# Patient Record
Sex: Male | Born: 1985 | Race: Black or African American | Hispanic: No | Marital: Single | Smoking: Never smoker
Health system: Southern US, Community
[De-identification: ages and names within clinical notes are randomized; demographics above are authoritative.]

---
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
## 2004-08-21 ENCOUNTER — Emergency Department (HOSPITAL_COMMUNITY): Admission: EM | Admit: 2004-08-21 | Discharge: 2004-08-21 | Payer: Self-pay | Admitting: *Deleted

## 2006-08-19 IMAGING — CR DG CHEST 2V
2 series · 2 of 2 positions shown · non-contrast
Comparison: none

HISTORY: Left chest pain

CHEST 2 VIEWS:
No prior study for comparison
Normal heart size, mediastinal contours, and vascularity.
Lungs clear.
No effusion or pneumothorax.
Bones unremarkable.

[view not recorded (1 of 2)]
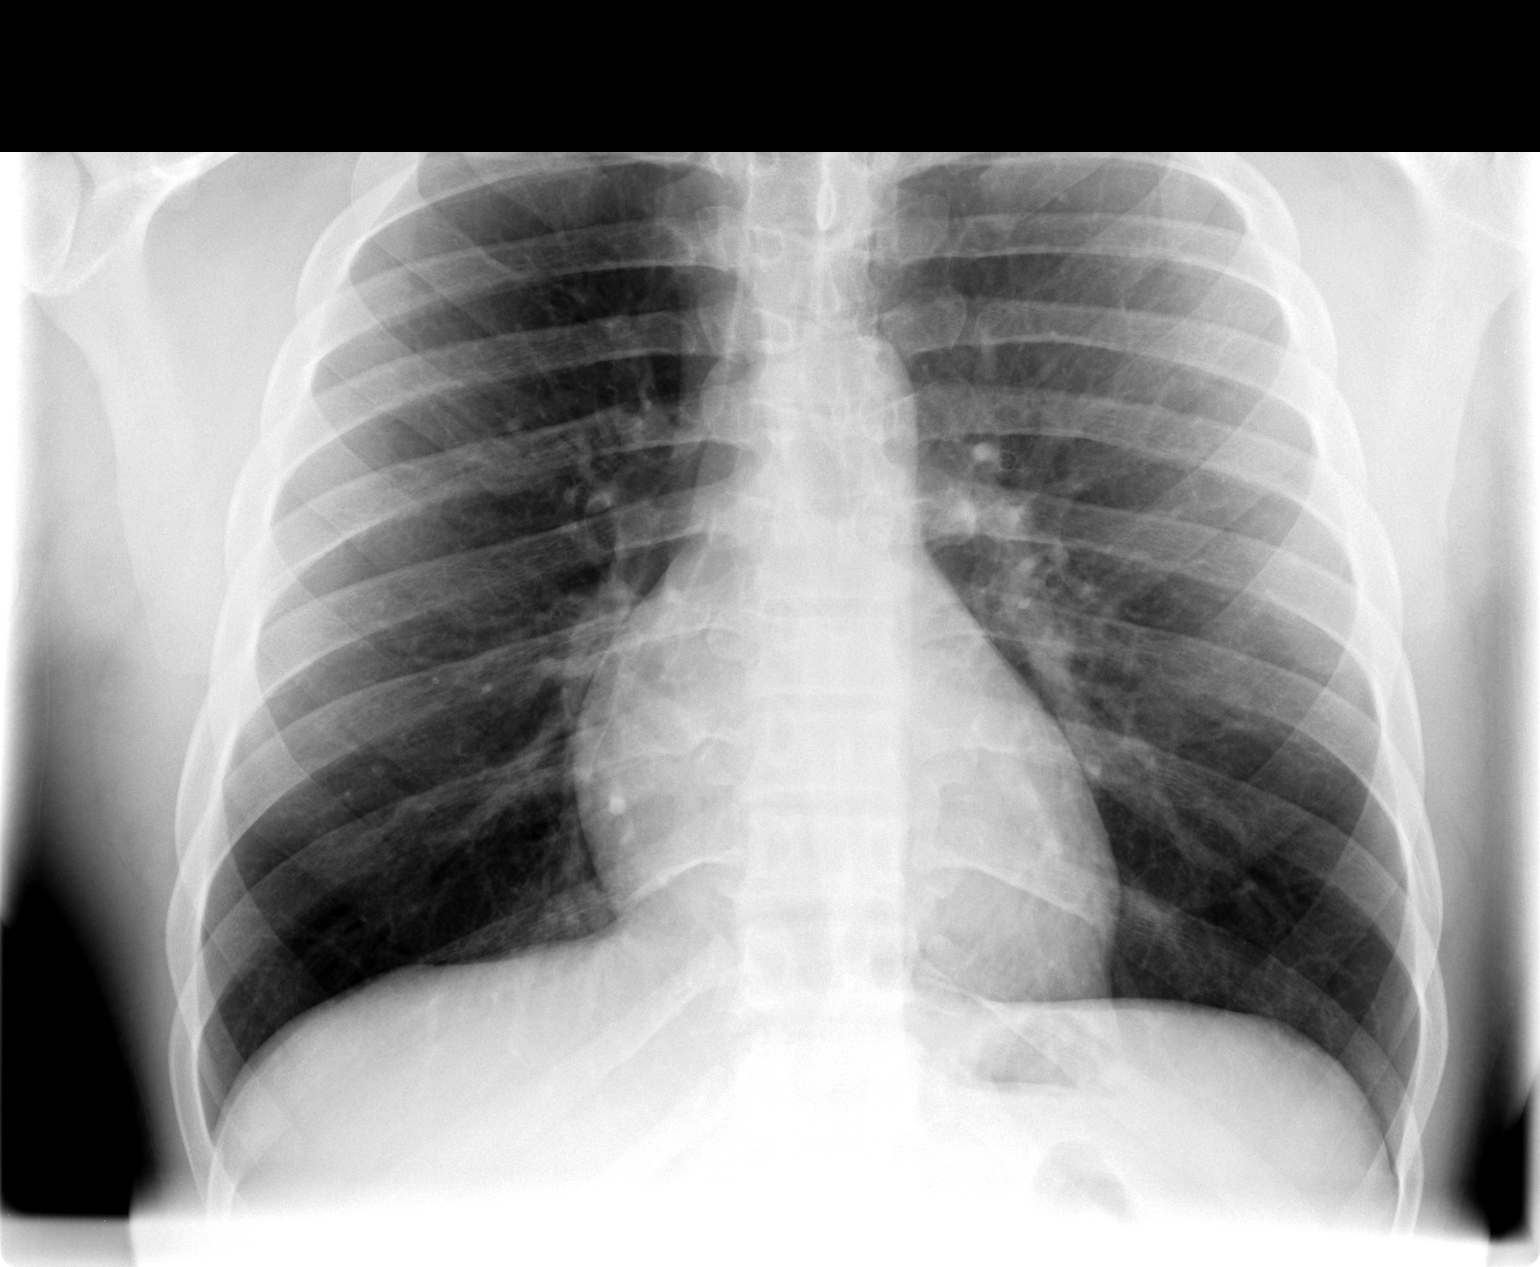

[view not recorded (2 of 2)]
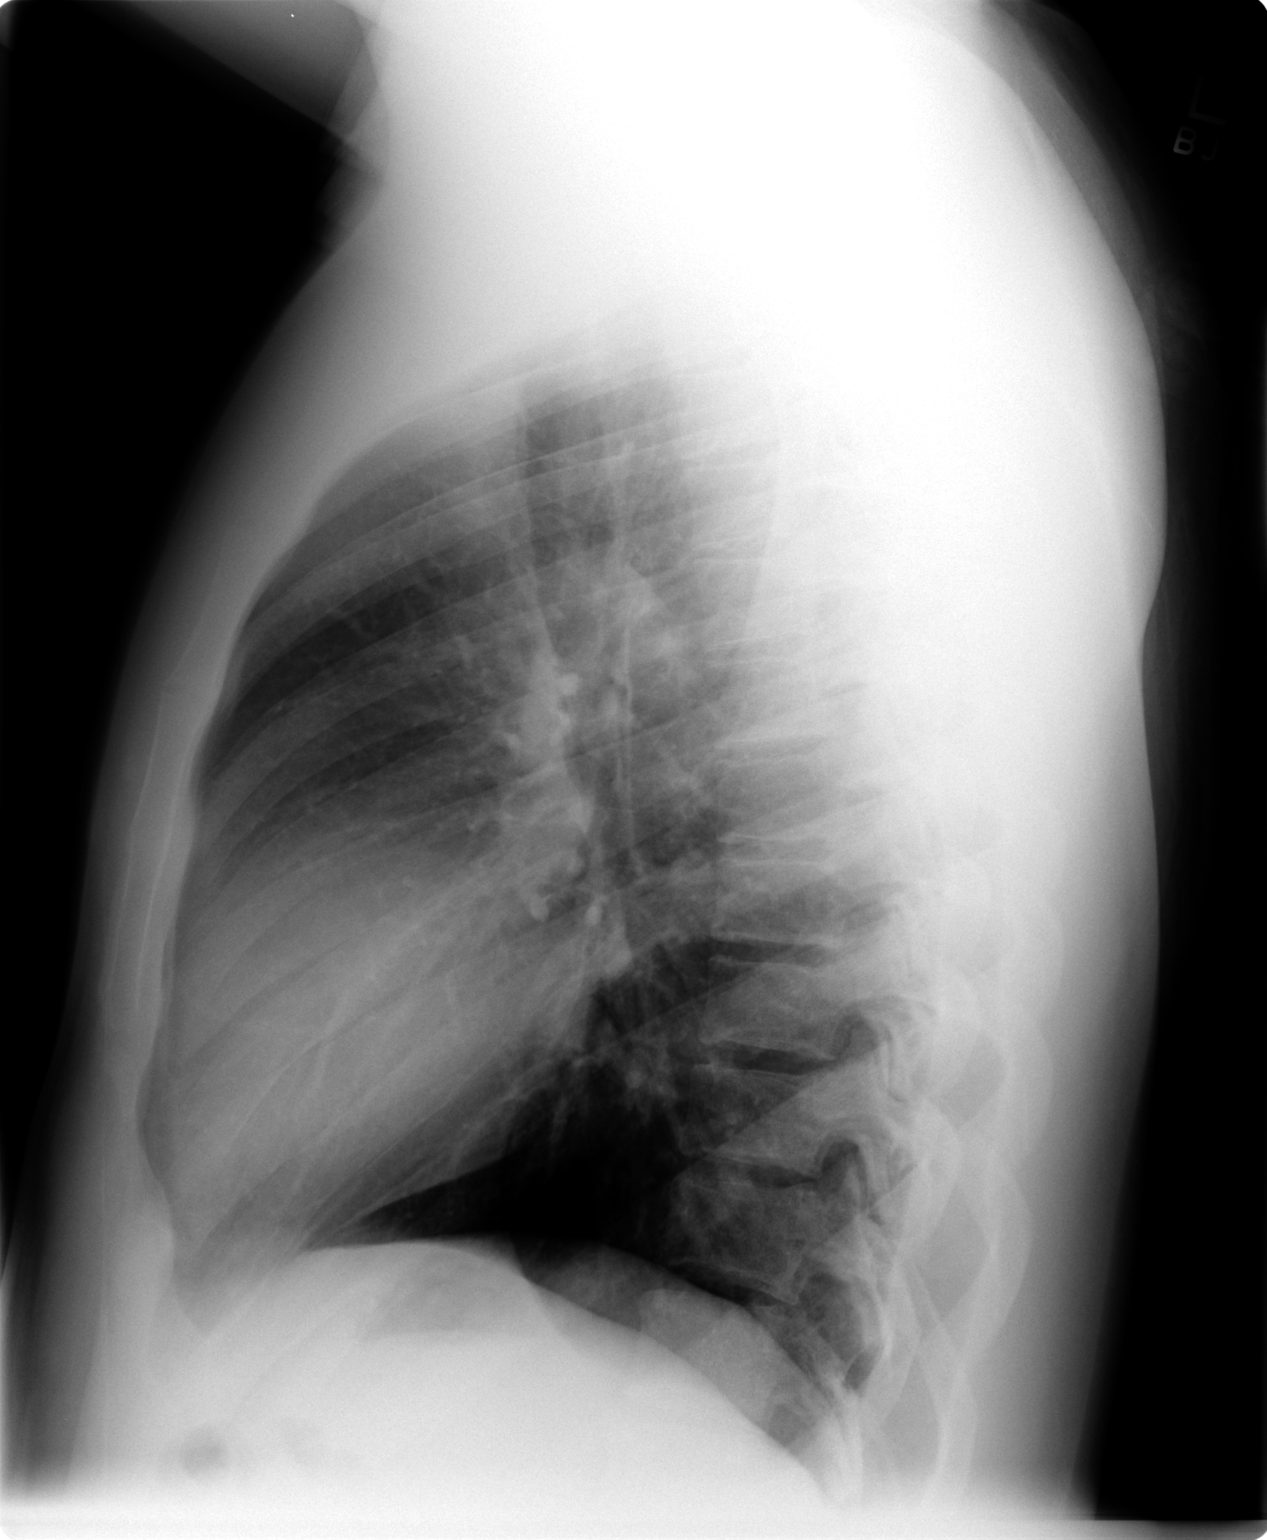

[2 of 2 positions shown; findings below may reference images not displayed]

IMPRESSION: No acute abnormalities.

## 2011-08-31 ENCOUNTER — Ambulatory Visit (INDEPENDENT_AMBULATORY_CARE_PROVIDER_SITE_OTHER): Payer: Managed Care, Other (non HMO)

## 2011-08-31 DIAGNOSIS — R05 Cough: Secondary | ICD-10-CM

## 2011-08-31 DIAGNOSIS — R509 Fever, unspecified: Secondary | ICD-10-CM

## 2011-08-31 DIAGNOSIS — R51 Headache: Secondary | ICD-10-CM

## 2011-08-31 DIAGNOSIS — R1110 Vomiting, unspecified: Secondary | ICD-10-CM

## 2011-10-24 ENCOUNTER — Ambulatory Visit: Payer: Managed Care, Other (non HMO)

## 2015-05-04 ENCOUNTER — Emergency Department (HOSPITAL_COMMUNITY)
Admission: EM | Admit: 2015-05-04 | Discharge: 2015-05-04 | Disposition: A | Discharge status: Home / Self Care | Payer: Self-pay | Attending: Emergency Medicine | Admitting: Emergency Medicine

## 2015-05-04 ENCOUNTER — Encounter (HOSPITAL_COMMUNITY): Payer: Self-pay | Admitting: Nurse Practitioner

## 2015-05-04 DIAGNOSIS — S0181XA Laceration without foreign body of other part of head, initial encounter: Secondary | ICD-10-CM | POA: Insufficient documentation

## 2015-05-04 DIAGNOSIS — W500XXA Accidental hit or strike by another person, initial encounter: Secondary | ICD-10-CM | POA: Insufficient documentation

## 2015-05-04 DIAGNOSIS — Y92322 Soccer field as the place of occurrence of the external cause: Secondary | ICD-10-CM | POA: Insufficient documentation

## 2015-05-04 DIAGNOSIS — Y999 Unspecified external cause status: Secondary | ICD-10-CM | POA: Insufficient documentation

## 2015-05-04 DIAGNOSIS — Y9366 Activity, soccer: Secondary | ICD-10-CM | POA: Insufficient documentation

## 2015-05-04 MED ORDER — TETANUS-DIPHTH-ACELL PERTUSSIS 5-2.5-18.5 LF-MCG/0.5 IM SUSP
0.5000 mL | Freq: Once | INTRAMUSCULAR | Status: AC
  Administered 2015-05-04: 0.5 mL via INTRAMUSCULAR
  Filled 2015-05-04: qty 0.5

## 2015-05-04 MED ORDER — HYDROCODONE-ACETAMINOPHEN 5-325 MG PO TABS
1.0000 | ORAL_TABLET | Freq: Once | ORAL | Status: DC
  Filled 2015-05-04: qty 1

## 2015-05-04 MED ORDER — LIDOCAINE-EPINEPHRINE (PF) 2 %-1:200000 IJ SOLN
10.0000 mL | Freq: Once | INTRAMUSCULAR | Status: DC
  Filled 2015-05-04: qty 20

## 2015-05-04 MED ORDER — LIDOCAINE-EPINEPHRINE-TETRACAINE (LET) SOLUTION
3.0000 mL | Freq: Once | NASAL | Status: AC
  Administered 2015-05-04: 3 mL via TOPICAL
  Filled 2015-05-04: qty 3

## 2015-05-04 NOTE — ED Notes (Signed)
He fell onto face while playing kickball today, denies LOC, denies vision changes, decreased vision, blurred vision. He has laceration to R cheek with no active bleeding. He is A&Ox4, breathing easily, ambulatory, mae.

## 2015-05-04 NOTE — Discharge Instructions (Signed)
Facial Laceration  A facial laceration is a cut on the face. These injuries can be painful and cause bleeding. Lacerations usually heal quickly, but they need special care to reduce scarring. DIAGNOSIS  Your health care provider will take a medical history, ask for details about how the injury occurred, and examine the wound to determine how deep the cut is. TREATMENT  Some facial lacerations may not require closure. Others may not be able to be closed because of an increased risk of infection. The risk of infection and the chance for successful closure will depend on various factors, including the amount of time since the injury occurred. The wound may be cleaned to help prevent infection. If closure is appropriate, pain medicines may be given if needed. Your health care provider will use stitches (sutures), wound glue (adhesive), or skin adhesive strips to repair the laceration. These tools bring the skin edges together to allow for faster healing and a better cosmetic outcome. If needed, you may also be given a tetanus shot. HOME CARE INSTRUCTIONS  Only take over-the-counter or prescription medicines as directed by your health care provider.  Follow your health care provider's instructions for wound care. These instructions will vary depending on the technique used for closing the wound. For Sutures:  Keep the wound clean and dry.   If you were given a bandage (dressing), you should change it at least once a day. Also change the dressing if it becomes wet or dirty, or as directed by your health care provider.   Wash the wound with soap and water 2 times a day. Rinse the wound off with water to remove all soap. Pat the wound dry with a clean towel.   After cleaning, apply a thin layer of the antibiotic ointment recommended by your health care provider. This will help prevent infection and keep the dressing from sticking.   You may shower as usual after the first 24 hours. Do not soak the  wound in water until the sutures are removed.   Get your sutures removed as directed by your health care provider. With facial lacerations, sutures should usually be taken out after 4-5 days to avoid stitch marks.   Wait a few days after your sutures are removed before applying any makeup. For Skin Adhesive Strips:  Keep the wound clean and dry.   Do not get the skin adhesive strips wet. You may bathe carefully, using caution to keep the wound dry.   If the wound gets wet, pat it dry with a clean towel.   Skin adhesive strips will fall off on their own. You may trim the strips as the wound heals. Do not remove skin adhesive strips that are still stuck to the wound. They will fall off in time.  For Wound Adhesive:  You may briefly wet your wound in the shower or bath. Do not soak or scrub the wound. Do not swim. Avoid periods of heavy sweating until the skin adhesive has fallen off on its own. After showering or bathing, gently pat the wound dry with a clean towel.   Do not apply liquid medicine, cream medicine, ointment medicine, or makeup to your wound while the skin adhesive is in place. This may loosen the film before your wound is healed.   If a dressing is placed over the wound, be careful not to apply tape directly over the skin adhesive. This may cause the adhesive to be pulled off before the wound is healed.   Avoid   prolonged exposure to sunlight or tanning lamps while the skin adhesive is in place.  The skin adhesive will usually remain in place for 5-10 days, then naturally fall off the skin. Do not pick at the adhesive film.  After Healing: Once the wound has healed, cover the wound with sunscreen during the day for 1 full year. This can help minimize scarring. Exposure to ultraviolet light in the first year will darken the scar. It can take 1-2 years for the scar to lose its redness and to heal completely.  SEEK IMMEDIATE MEDICAL CARE IF:  You have redness, pain, or  swelling around the wound.   You see ayellowish-white fluid (pus) coming from the wound.   You have chills or a fever.  MAKE SURE YOU:  Understand these instructions.  Will watch your condition.  Will get help right away if you are not doing well or get worse. Document Released: 09/09/2004 Document Revised: 05/23/2013 Document Reviewed: 03/15/2013 [employer] Patient Information 2015 [employer]. This information is not intended to replace advice given to you by your health care provider. Make sure you discuss any questions you have with your health care provider.  

## 2015-05-04 NOTE — ED Notes (Signed)
Declined W/C at D/C and was escorted to lobby by RN. 

## 2015-05-04 NOTE — ED Provider Notes (Signed)
CSN: [id number]     Arrival date & time 05/04/15  1513 History  This chart was scribed for non-physician practitioner Tommy Rainwater, working with Arby Barrette, MD by Leone Payor, ED Scribe. This patient was seen in room TR03C/TR03C and the patient's care was started at 4:54 PM.    Chief Complaint  Patient presents with  . Facial Laceration   The history is provided by the patient. No language interpreter was used.     HPI Comments: Christopher Rollins is a 29 y.o. male who presents to the Emergency Department complaining of a facial laceration to the right cheek that occurred over 1 hour ago. He states he was playing kickball when he fell down, striking his right cheek on the ground. He had a mild HA which has since resolved.  He has applied gauze to the affected area and there is no active bleeding. He denies LOC, associated facial pain, neck pain, visual disturbances. His last tetanus is unknown.   History reviewed. No pertinent past medical history. History reviewed. No pertinent past surgical history. History reviewed. No pertinent family history. Social History  Substance Use Topics  . Smoking status: Never Smoker   . Smokeless tobacco: None  . Alcohol Use: Yes    Review of Systems  Musculoskeletal: Negative for neck pain.  Skin: Positive for wound (laceration).  Neurological: Positive for headaches (resolved). Negative for syncope.    Allergies  Review of patient's allergies indicates no known allergies.  Home Medications   Prior to Admission medications   Not on File   BP 140/78 mmHg  Pulse 80  Temp(Src) 98.6 F (37 C)  Resp 16  Ht  (1.854 m)  Wt 210 lb (95.255 kg)  BMI 27.71 kg/m2  SpO2 100% Physical Exam  Constitutional: He is oriented to person, place, and time. He appears well-developed and well-nourished.  HENT:  Head: Normocephalic.  4 cm crescent laceration across right cheek prominence. No bony facial tenderness. Mild immediate swelling. No  active bleeding.   Eyes: Conjunctivae and EOM are normal. Pupils are equal, round, and reactive to light.  Full pain-free ROM of EOM  Neck: Normal range of motion. Neck supple. No tracheal deviation present.  Cardiovascular: Normal rate.   Pulmonary/Chest: Effort normal.  Abdominal: He exhibits no distension.  Neurological: He is alert and oriented to person, place, and time.  Skin: Skin is warm and dry. Laceration noted.  Psychiatric: He has a normal mood and affect.  Nursing note and vitals reviewed.   ED Course  Procedures (including critical care time)  DIAGNOSTIC STUDIES: Oxygen Saturation is 100% on RA, normal by my interpretation.    COORDINATION OF CARE: 5:00 PM Will repair laceration. Discussed treatment plan with pt at bedside and pt agreed to plan.   Labs Review Labs Reviewed - No data to display  Imaging Review No results found. LACERATION REPAIR Performed by: Elpidio Anis A Authorized by: Elpidio Anis A Consent: Verbal consent obtained. Risks and benefits: risks, benefits and alternatives were discussed Consent given by: patient Patient identity confirmed: provided demographic data Prepped and Draped in normal sterile fashion Wound explored  Laceration Location: right cheek  Laceration Length: 4cm  No Foreign Bodies seen or palpated  Anesthesia: local infiltration  Local anesthetic: lidocaine 2% w/epinephrine, L.E.T.  Anesthetic total: 1 ml, 3ml  Irrigation method: syringe Amount of cleaning: standard Wound margin debridement - moderate  Skin closure: 6-0 ethilon  Number of sutures: 16  Technique: simple interrupted  Patient tolerance: Patient tolerated the procedure well with no immediate complications.    EKG Interpretation None      MDM   Final diagnoses:  None    1. Facial laceration  Laceration after collision with another player during soccer match. No LOC, nausea, visual change or eye pain/tenderness. Doubt facial bone  fracture or other injury without direct tenderness of facial bones, neck. Sutured as above note.   I personally performed the services described in this documentation, which was scribed in my presence. The recorded information has been reviewed and is accurate.     Elpidio Anis, PA-C 05/04/15 1840  Arby Barrette, MD 05/04/15 [phone]

## 2015-05-04 NOTE — ED Notes (Signed)
Suture cart bedside.
